# Patient Record
Sex: Female | Born: 1995 | Race: White | Hispanic: No | Marital: Single | Smoking: Never smoker
Health system: Southern US, Community
[De-identification: ages and names within clinical notes are randomized; demographics above are authoritative.]

## PROBLEM LIST (undated history)

## (undated) DIAGNOSIS — F909 Attention-deficit hyperactivity disorder, unspecified type: Secondary | ICD-10-CM

## (undated) DIAGNOSIS — F419 Anxiety disorder, unspecified: Secondary | ICD-10-CM

## (undated) DIAGNOSIS — F329 Major depressive disorder, single episode, unspecified: Secondary | ICD-10-CM

## (undated) DIAGNOSIS — F32A Depression, unspecified: Secondary | ICD-10-CM

## (undated) HISTORY — PX: BACK SURGERY: SHX140

## (undated) HISTORY — PX: SPINAL FUSION: SHX223

---
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
## 2016-11-29 ENCOUNTER — Emergency Department (INDEPENDENT_AMBULATORY_CARE_PROVIDER_SITE_OTHER)
Admission: EM | Admit: 2016-11-29 | Discharge: 2016-11-29 | Disposition: A | Discharge status: Home / Self Care | Payer: 59 | Source: Home / Self Care

## 2016-11-29 ENCOUNTER — Encounter: Payer: Self-pay | Admitting: *Deleted

## 2016-11-29 DIAGNOSIS — Z111 Encounter for screening for respiratory tuberculosis: Secondary | ICD-10-CM | POA: Diagnosis not present

## 2016-11-29 MED ORDER — TUBERCULIN PPD 5 UNIT/0.1ML ID SOLN
5.0000 [IU] | Freq: Once | INTRADERMAL | Status: DC
  Administered 2016-11-29: 5 [IU] via INTRADERMAL

## 2016-11-29 NOTE — ED Triage Notes (Signed)
The patient is here today for PPD placement. Brittany Lampkins, LPN   

## 2016-12-02 ENCOUNTER — Emergency Department
Admission: EM | Admit: 2016-12-02 | Discharge: 2016-12-02 | Disposition: A | Discharge status: Home / Self Care | Payer: 59 | Source: Home / Self Care

## 2016-12-02 NOTE — ED Notes (Signed)
Patient returned on 12/02/2016 at 5:30PM to have her TB skin test read. 0 MM result, Negative, a copy was given to the patient.

## 2017-06-01 IMAGING — DX DG ANKLE COMPLETE 3+V*L*
3 series · 3 of 3 positions shown · non-contrast
Comparison: None.

CLINICAL DATA: 20-year-old female with trauma to the left ankle.

EXAM:
LEFT ANKLE COMPLETE - 3+ VIEW; LEFT FOOT - COMPLETE 3+ VIEW

[ankle ap]
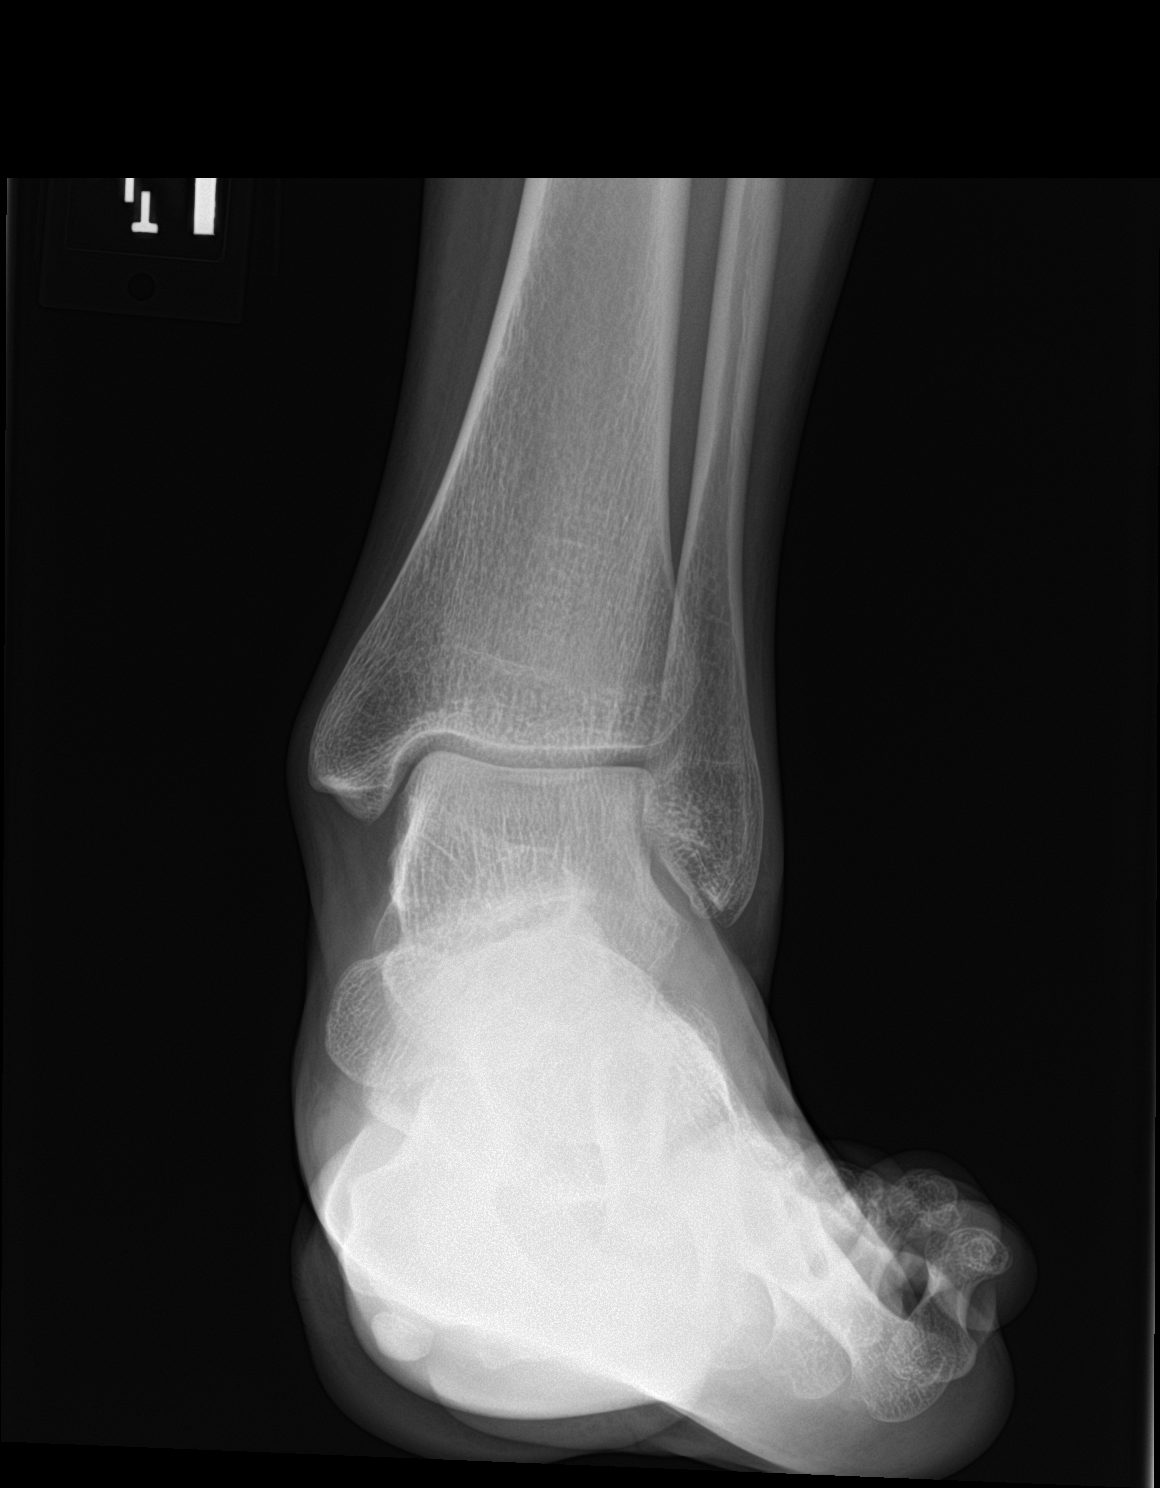

[ankle obl]
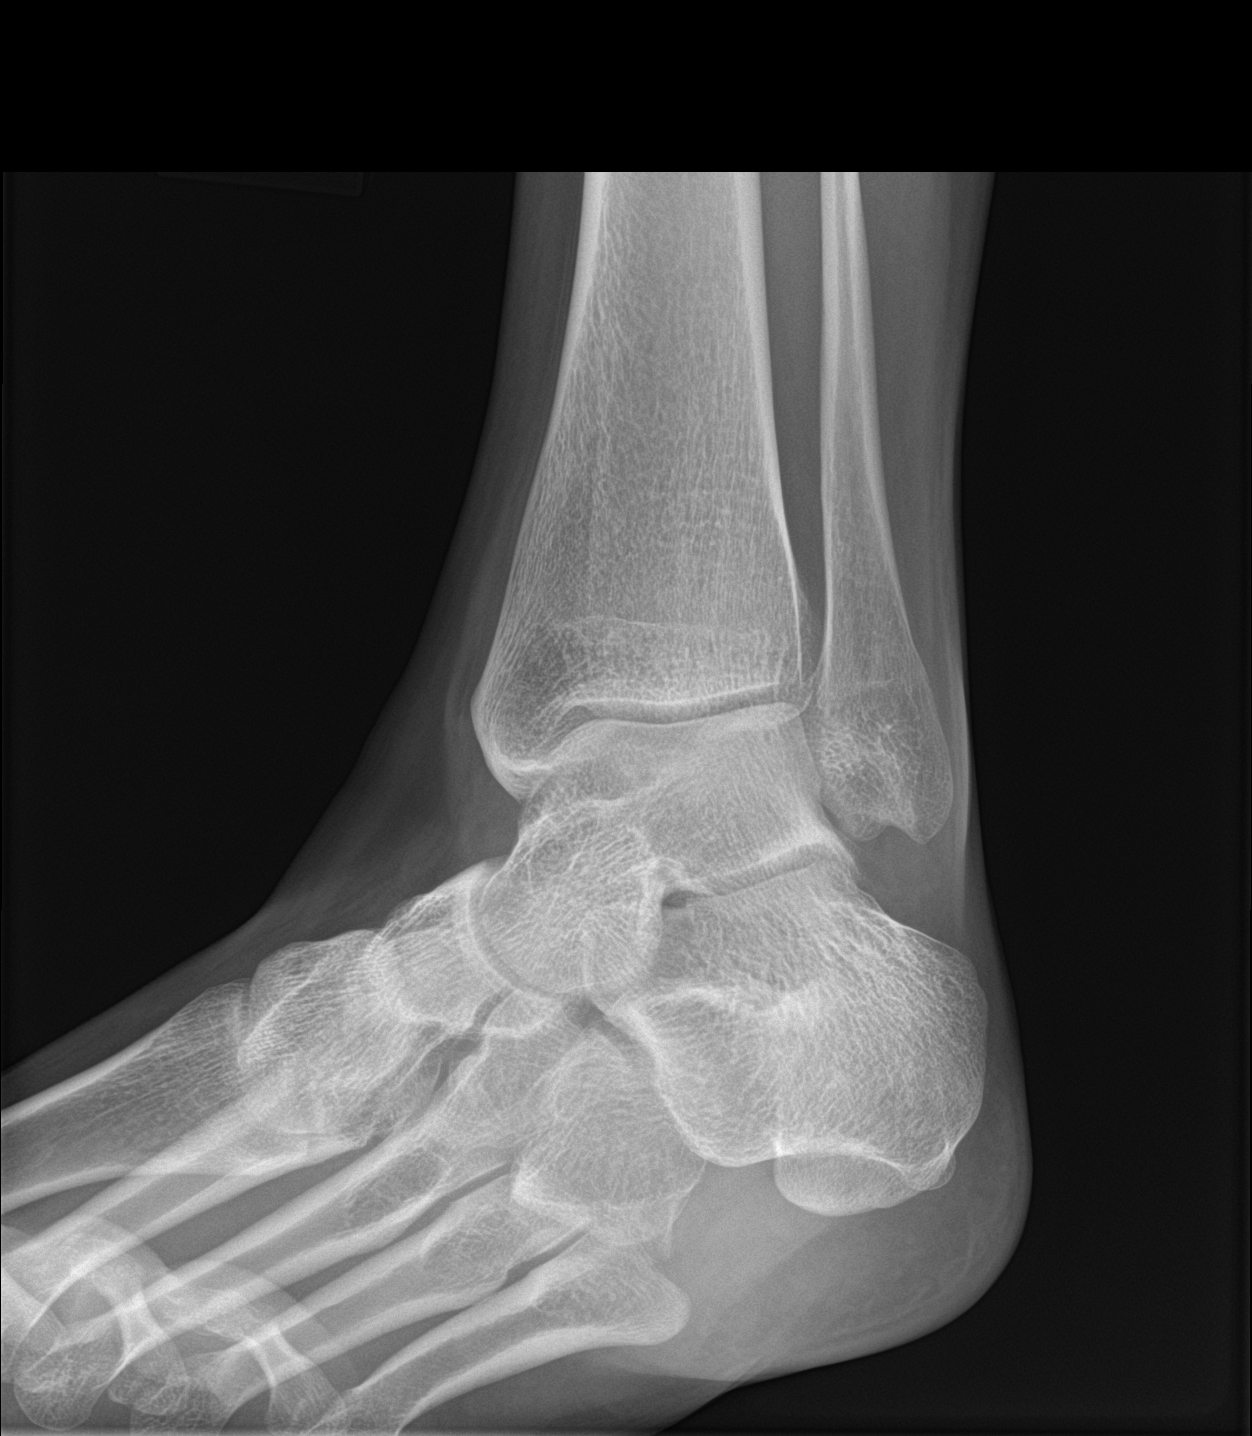

[ankle lat]
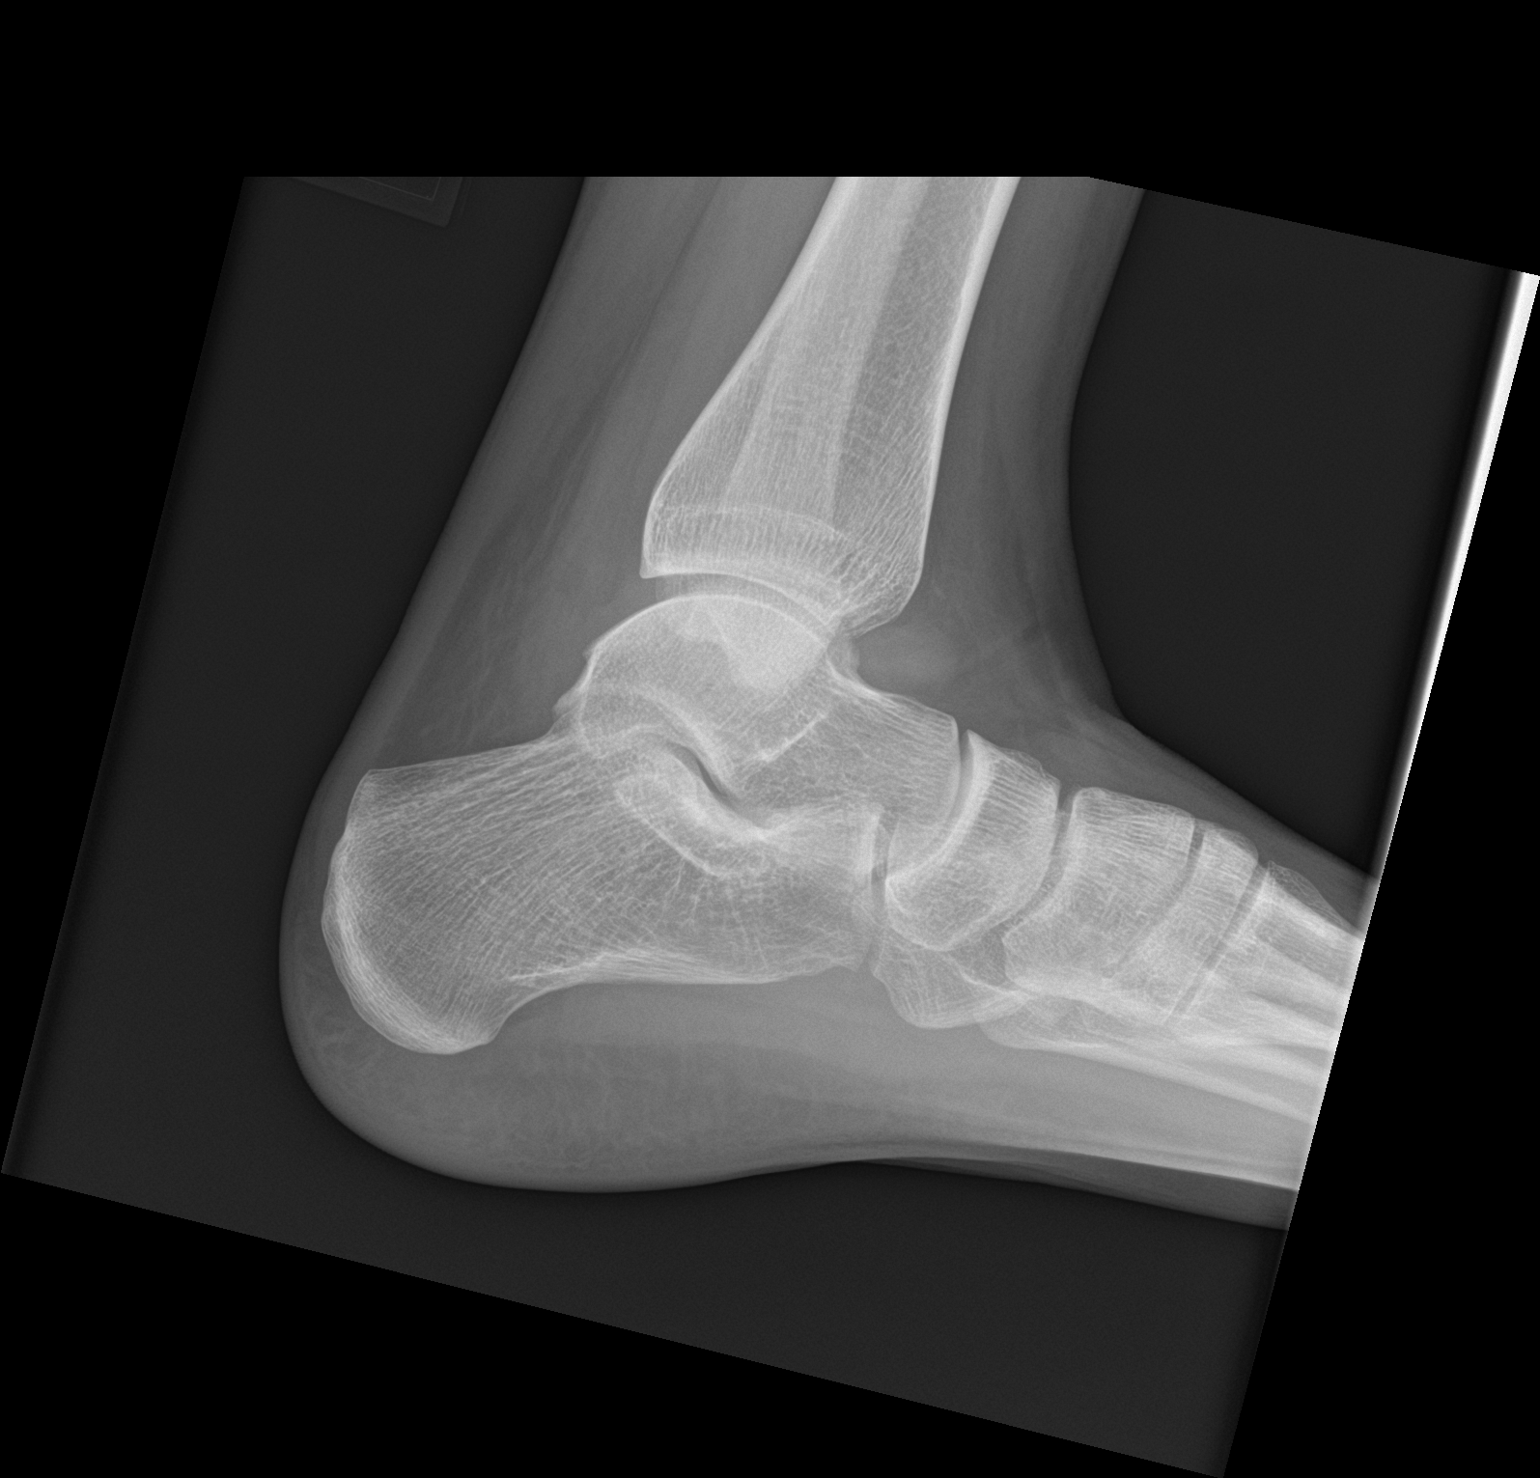

[3 of 3 positions shown; findings below may reference images not displayed]

FINDINGS: There is no evidence of fracture, dislocation, or joint effusion.
There is no evidence of arthropathy or other focal bone abnormality.
Soft tissues are unremarkable.
IMPRESSION: Negative.

## 2017-08-18 ENCOUNTER — Emergency Department
Admission: EM | Admit: 2017-08-18 | Discharge: 2017-08-18 | Disposition: A | Discharge status: Home / Self Care | Payer: 59 | Source: Home / Self Care

## 2017-08-18 ENCOUNTER — Encounter: Payer: Self-pay | Admitting: Emergency Medicine

## 2017-08-18 DIAGNOSIS — B2790 Infectious mononucleosis, unspecified without complication: Secondary | ICD-10-CM | POA: Diagnosis not present

## 2017-08-18 MED ORDER — DEXAMETHASONE SODIUM PHOSPHATE 10 MG/ML IJ SOLN
10.0000 mg | Freq: Once | INTRAMUSCULAR | Status: AC
  Administered 2017-08-18: 10 mg via INTRAMUSCULAR

## 2017-08-18 MED ORDER — HYDROCODONE-ACETAMINOPHEN 7.5-325 MG/15ML PO SOLN: 10.0000 mL | Freq: Four times a day (QID) | ORAL | 0 refills | Status: AC | PRN

## 2017-08-18 MED ORDER — MAGIC MOUTHWASH W/LIDOCAINE: 5.0000 mL | Freq: Four times a day (QID) | ORAL | 0 refills | Status: AC | PRN

## 2017-08-18 NOTE — ED Triage Notes (Signed)
Pt states she had labs and was dx with mono [facility]. States throat pain has worsened and no relief with ibuprofen. Had neg strep test at visit.

## 2017-08-19 NOTE — ED Provider Notes (Signed)
[facility]    CSN: [id number] Arrival date & time: 08/18/17  1104     History   Chief Complaint Chief Complaint  Patient presents with  . Sore Throat    HPI Brittany Sims is a 22 y.o. female.   The history is provided by the patient. No language interpreter was used.  Sore Throat  This is a new problem. The current episode started more than 1 week ago. The problem occurs constantly. The problem has been gradually worsening. Nothing aggravates the symptoms. Nothing relieves the symptoms. She has tried acetaminophen for the symptoms. The treatment provided no relief.  Pt was diagnosed with mono at [facility].    History reviewed. No pertinent past medical history.  There are no active problems to display for this patient.   History reviewed. No pertinent surgical history.  OB History   None      Home Medications    Prior to Admission medications   Medication Sig Start Date End Date Taking? Authorizing Provider  sertraline (ZOLOFT) 100 MG tablet Take 100 mg by mouth daily.   Yes [provider]  HYDROcodone-acetaminophen (HYCET) 7.5-325 mg/15 ml solution Take 10 mLs by mouth every 6 (six) hours as needed for up to 4 days for moderate pain. 08/18/17 08/22/17  Elson Areas, PA-C  magic mouthwash w/lidocaine SOLN Take 5 mLs by mouth 4 (four) times daily as needed for mouth pain. 08/18/17   Elson Areas, PA-C    Family History History reviewed. No pertinent family history.  Social History Social History   Tobacco Use  . Smoking status: Never Smoker  . Smokeless tobacco: Never Used  Substance Use Topics  . Alcohol use: No  . Drug use: No     Allergies   Patient has no known allergies.   Review of Systems Review of Systems   Physical Exam Triage Vital Signs ED Triage Vitals  Enc Vitals Group     BP 08/18/17 1122 134/78     Pulse Rate 08/18/17 1122 98     Resp --      Temp 08/18/17 1122 99.1 F (37.3 C)     Temp  Source 08/18/17 1122 Oral     SpO2 08/18/17 1122 100 %     Weight 08/18/17 1125 132 lb (59.9 kg)     Height --      Head Circumference --      Peak Flow --      Pain Score 08/18/17 1125 0     Pain Loc --      Pain Edu? --      Excl. in GC? --    No data found.  Updated Vital Signs BP 134/78 (BP Location: Right Arm)   Pulse 98   Temp 99.1 F (37.3 C) (Oral)   Wt 132 lb (59.9 kg)   LMP 08/18/2017   SpO2 100%   BMI 19.49 kg/m   Visual Acuity Right Eye Distance:   Left Eye Distance:   Bilateral Distance:    Right Eye Near:   Left Eye Near:    Bilateral Near:     Physical Exam  Constitutional: She appears well-developed and well-nourished. No distress.  HENT:  Head: Normocephalic and atraumatic.  Mouth/Throat: Oropharynx is clear and moist and mucous membranes are normal. Tonsils are 2+ on the right. Tonsils are 2+ on the left.  Eyes: Pupils are equal, round, and reactive to light. Conjunctivae and EOM are normal.  Neck: Neck supple.  Cardiovascular: Normal rate and regular rhythm.  No murmur heard. Pulmonary/Chest: Effort normal and breath sounds normal. No respiratory distress.  Abdominal: Soft. There is no tenderness.  Musculoskeletal: She exhibits no edema.  Neurological: She is alert.  Skin: Skin is warm and dry.  Psychiatric: She has a normal mood and affect.  Nursing note and vitals reviewed.    UC Treatments / Results  Labs (all labs ordered are listed, but only abnormal results are displayed) Labs Reviewed - No data to display  EKG None Radiology No results found.  Procedures Procedures (including critical care time)  Medications Ordered in UC Medications  dexamethasone (DECADRON) injection 10 mg (10 mg Intramuscular Given 08/18/17 1205)     Initial Impression / Assessment and Plan / UC Course  I have reviewed the triage vital signs and the nursing notes.  Pertinent labs & imaging results that were available during my care of the patient  were reviewed by me and considered in my medical decision making (see chart for details).     Pt given decadron IM.   I will try magic mouthwash to help with symptoms.  Pt given hydrocdone for pain.  Pt advised to drink plenty of fluids and protect her spleen.   Final Clinical Impressions(s) / UC Diagnoses   Final diagnoses:  Infectious mononucleosis without complication, infectious mononucleosis due to unspecified organism    ED Discharge Orders        Ordered    HYDROcodone-acetaminophen (HYCET) 7.5-325 mg/15 ml solution  Every 6 hours PRN     08/18/17 1201    magic mouthwash w/lidocaine SOLN  4 times daily PRN     08/18/17 1201     An After Visit Summary was printed and given to the patient.   Controlled Substance Prescriptions [place] Controlled Substance Registry consulted? Yes, I have consulted the [place] Controlled Substances Registry for this patient, and feel the risk/benefit ratio today is favorable for proceeding with this prescription for a controlled substance.   Elson AreasSofia, Leslie K, [place]PA-C 08/19/17 2025

## 2019-04-14 DIAGNOSIS — M4125 Other idiopathic scoliosis, thoracolumbar region: Secondary | ICD-10-CM | POA: Insufficient documentation

## 2019-11-22 ENCOUNTER — Other Ambulatory Visit: Payer: Self-pay

## 2019-11-22 ENCOUNTER — Encounter (HOSPITAL_COMMUNITY): Payer: Self-pay

## 2019-11-22 ENCOUNTER — Ambulatory Visit (HOSPITAL_COMMUNITY)
Admission: RE | Admit: 2019-11-22 | Discharge: 2019-11-22 | Disposition: A | Discharge status: Home / Self Care | Payer: BC Managed Care – PPO | Source: Ambulatory Visit | Attending: Family Medicine | Admitting: Family Medicine

## 2019-11-22 VITALS — BP 131/82 | HR 114 | Temp 99.0°F | Resp 18

## 2019-11-22 DIAGNOSIS — R300 Dysuria: Secondary | ICD-10-CM | POA: Insufficient documentation

## 2019-11-22 DIAGNOSIS — Z7251 High risk heterosexual behavior: Secondary | ICD-10-CM | POA: Diagnosis present

## 2019-11-22 DIAGNOSIS — N898 Other specified noninflammatory disorders of vagina: Secondary | ICD-10-CM | POA: Diagnosis present

## 2019-11-22 DIAGNOSIS — Z3202 Encounter for pregnancy test, result negative: Secondary | ICD-10-CM

## 2019-11-22 HISTORY — DX: Anxiety disorder, unspecified: F41.9

## 2019-11-22 HISTORY — DX: Depression, unspecified: F32.A

## 2019-11-22 HISTORY — DX: Attention-deficit hyperactivity disorder, unspecified type: F90.9

## 2019-11-22 LAB — POCT URINALYSIS DIP (DEVICE)
Bilirubin Urine: NEGATIVE
Glucose, UA: NEGATIVE mg/dL
Ketones, ur: NEGATIVE mg/dL
Leukocytes,Ua: NEGATIVE
Nitrite: NEGATIVE
Protein, ur: NEGATIVE mg/dL
Specific Gravity, Urine: 1.02 (ref 1.005–1.030)
Urobilinogen, UA: 0.2 mg/dL (ref 0.0–1.0)
pH: 5.5 (ref 5.0–8.0)

## 2019-11-22 LAB — POC URINE PREG, ED: Preg Test, Ur: NEGATIVE

## 2019-11-22 MED ORDER — CEFTRIAXONE SODIUM 500 MG IJ SOLR
500.0000 mg | Freq: Once | INTRAMUSCULAR | Status: AC
  Administered 2019-11-22: 500 mg via INTRAMUSCULAR

## 2019-11-22 MED ORDER — CEFTRIAXONE SODIUM 500 MG IJ SOLR
INTRAMUSCULAR | Status: AC
  Filled 2019-11-22: qty 500

## 2019-11-22 MED ORDER — DOXYCYCLINE HYCLATE 100 MG PO CAPS: 100.0000 mg | ORAL_CAPSULE | Freq: Two times a day (BID) | ORAL | 0 refills | Status: AC

## 2019-11-22 MED ORDER — LIDOCAINE HCL (PF) 1 % IJ SOLN
INTRAMUSCULAR | Status: AC
  Filled 2019-11-22: qty 2

## 2019-11-22 NOTE — Discharge Instructions (Signed)

## 2019-11-22 NOTE — ED Triage Notes (Signed)
C/O dysuria x 3 days; yesterday had fever up to 102.4.  C/O "a little bit" of diarrhea.  Denies polyuria, hematuria.  C/O pain across low back.

## 2019-11-22 NOTE — ED Provider Notes (Signed)
[facility]   [id number] 11/22/19 Arrival Time: 1149  ASSESSMENT & PLAN:  1. Dysuria   2. Vaginal discharge   3. High risk heterosexual behavior     U/A without signs of infection. Culture sent. Vaginal cytology pending.   Discharge Instructions     You have been given the following today for treatment of suspected gonorrhea and/or chlamydia:  cefTRIAXone (ROCEPHIN) injection 500 mg  Please pick up your prescription for doxycycline 100 mg and begin taking twice daily for the next seven (7) days.  Even though we have treated you today, we have sent testing for sexually transmitted infections. We will notify you of any positive results once they are received. If required, we will prescribe any medications you might need.  Please refrain from all sexual activity for at least the next seven days.     Without s/s of PID.  Labs Reviewed  POCT URINALYSIS DIP (DEVICE) - Abnormal; Notable for the following components:      Result Value   Hgb urine dipstick TRACE (*)    All other components within normal limits  URINE CULTURE  POC URINE PREG, ED  CERVICOVAGINAL ANCILLARY ONLY    Will notify of any positive results. Instructed to refrain from sexual activity for at least seven days.  Reviewed expectations re: course of current medical issues. Questions answered. Outlined signs and symptoms indicating need for more acute intervention. Patient verbalized understanding. After Visit Summary given.   SUBJECTIVE:  Brittany Sims is a 24 y.o. female who presents with complaint of dysuria first noted 2-3 days ago but is improving. Scant clear vaginal discharge also reported. No specific aggravating or alleviating factors reported. Denies: urinary frequency and gross hematuria. Afebrile. No abdominal or pelvic pain. Normal PO intake wihout n/v. No genital rashes or lesions. Reports that she is sexually active with multiple female partners (3) OTC treatment: none  reported. History of STI: none reported.  Patient's last menstrual period was 11/15/2019 (approximate).   OBJECTIVE:  Vitals:   11/22/19 1224  BP: 131/82  Pulse: (!) 114  Resp: 18  Temp: 99 F (37.2 C)  SpO2: 99%     General appearance: alert, cooperative, appears stated age and no distress Lungs: unlabored respirations; speaks full sentences without difficulty Back: no CVA tenderness reported; FROM at waist GU: deferred Skin: warm and dry Psychological: alert and cooperative; normal mood and affect.  Results for orders placed or performed during the hospital encounter of 11/22/19  POCT urinalysis dip (device)  Result Value Ref Range   Glucose, UA NEGATIVE NEGATIVE mg/dL   Bilirubin Urine NEGATIVE NEGATIVE   Ketones, ur NEGATIVE NEGATIVE mg/dL   Specific Gravity, Urine 1.020 1.005 - 1.030   Hgb urine dipstick TRACE (A) NEGATIVE   pH 5.5 5.0 - 8.0   Protein, ur NEGATIVE NEGATIVE mg/dL   Urobilinogen, UA 0.2 0.0 - 1.0 mg/dL   Nitrite NEGATIVE NEGATIVE   Leukocytes,Ua NEGATIVE NEGATIVE  POC urine preg, ED (not at [facility])  Result Value Ref Range   Preg Test, Ur NEGATIVE NEGATIVE    Labs Reviewed  POCT URINALYSIS DIP (DEVICE) - Abnormal; Notable for the following components:      Result Value   Hgb urine dipstick TRACE (*)    All other components within normal limits  URINE CULTURE  POC URINE PREG, ED  CERVICOVAGINAL ANCILLARY ONLY    No Known Allergies  Past Medical History:  Diagnosis Date  . ADHD   . Anxiety   . Depression  Family History  Problem Relation Age of Onset  . Healthy Mother   . Healthy Father    Social History   Socioeconomic History  . Marital status: Single    Spouse name: Not on file  . Number of children: Not on file  . Years of education: Not on file  . Highest education level: Not on file  Occupational History  . Not on file  Tobacco Use  . Smoking status: Never Smoker  . Smokeless tobacco: Never Used  Vaping Use  .  Vaping Use: Never used  Substance and Sexual Activity  . Alcohol use: Yes    Comment: occasionally  . Drug use: No  . Sexual activity: Yes    Birth control/protection: Pill, Condom  Other Topics Concern  . Not on file  Social History Narrative  . Not on file   Social Determinants of Health   Financial Resource Strain:   . Difficulty of Paying Living Expenses:   Food Insecurity:   . Worried About Programme researcher, broadcasting/film/video in the Last Year:   . Barista in the Last Year:   Transportation Needs:   . Freight forwarder (Medical):   Marland Kitchen Lack of Transportation (Non-Medical):   Physical Activity:   . Days of Exercise per Week:   . Minutes of Exercise per Session:   Stress:   . Feeling of Stress :   Social Connections:   . Frequency of Communication with Friends and Family:   . Frequency of Social Gatherings with Friends and Family:   . Attends Religious Services:   . Active Member of Clubs or Organizations:   . Attends Banker Meetings:   Marland Kitchen Marital Status:   Intimate Partner Violence:   . Fear of Current or Ex-Partner:   . Emotionally Abused:   Marland Kitchen Physically Abused:   . Sexually Abused:           Mardella Layman, MD 11/22/19 [phone]

## 2019-11-23 LAB — URINE CULTURE: Culture: NO GROWTH

## 2019-11-25 LAB — CERVICOVAGINAL ANCILLARY ONLY
Bacterial Vaginitis (gardnerella): POSITIVE — AB
Candida Glabrata: NEGATIVE
Candida Vaginitis: POSITIVE — AB
Chlamydia: NEGATIVE
Comment: NEGATIVE
Comment: NEGATIVE
Comment: NEGATIVE
Comment: NEGATIVE
Comment: NEGATIVE
Comment: NORMAL
Neisseria Gonorrhea: NEGATIVE
Trichomonas: NEGATIVE

## 2019-11-26 ENCOUNTER — Telehealth (HOSPITAL_COMMUNITY): Payer: Self-pay | Admitting: Emergency Medicine

## 2019-11-26 MED ORDER — METRONIDAZOLE 500 MG PO TABS: 500.0000 mg | ORAL_TABLET | Freq: Two times a day (BID) | ORAL | 0 refills | Status: AC

## 2019-11-26 MED ORDER — FLUCONAZOLE 150 MG PO TABS: 150.0000 mg | ORAL_TABLET | Freq: Every day | ORAL | 0 refills | Status: AC

## 2019-12-31 ENCOUNTER — Emergency Department
Admission: RE | Admit: 2019-12-31 | Discharge: 2019-12-31 | Discharge status: Absent without leave | Payer: BC Managed Care – PPO | Source: Ambulatory Visit

## 2019-12-31 ENCOUNTER — Other Ambulatory Visit: Payer: Self-pay

## 2020-01-01 ENCOUNTER — Emergency Department (INDEPENDENT_AMBULATORY_CARE_PROVIDER_SITE_OTHER)
Admission: EM | Admit: 2020-01-01 | Discharge: 2020-01-01 | Disposition: A | Discharge status: Home / Self Care | Payer: BC Managed Care – PPO | Source: Home / Self Care

## 2020-01-01 ENCOUNTER — Encounter: Payer: Self-pay | Admitting: Emergency Medicine

## 2020-01-01 DIAGNOSIS — L089 Local infection of the skin and subcutaneous tissue, unspecified: Secondary | ICD-10-CM

## 2020-01-01 DIAGNOSIS — S60821A Blister (nonthermal) of right wrist, initial encounter: Secondary | ICD-10-CM

## 2020-01-01 MED ORDER — CEPHALEXIN 500 MG PO CAPS: 500.0000 mg | ORAL_CAPSULE | Freq: Two times a day (BID) | ORAL | 0 refills | Status: AC

## 2020-01-01 MED ORDER — MUPIROCIN 2 % EX OINT: TOPICAL_OINTMENT | CUTANEOUS | 0 refills | Status: AC

## 2020-01-01 NOTE — Discharge Instructions (Signed)
  Apply a warm damp washcloth to the area 2-3 times daily, keep clean with antibacterial soap. Pat dry, then apply the prescribed ointment.  Follow up with your family doctor in 4-5 days if not improving, sooner if worsening. You may return to urgent care if needed.

## 2020-01-01 NOTE — ED Provider Notes (Signed)
[facility]    CSN: [id number] Arrival date & time: 01/01/20  0813      History   Chief Complaint Chief Complaint  Patient presents with  . Blister    R arm    HPI Brittany Sims is a 24 y.o. female.   HPI Brittany Sims is a 24 y.o. female presenting to UC with c/o itchy red mildly painful blister on Right wrist that she noticed as small red bumps 3 days ago.  She cleaned the area with hydrogen peroxide the first 2 days.  Denies known insect bites. No known exposure to poison ivy.  No other rashes.     Past Medical History:  Diagnosis Date  . ADHD   . Anxiety   . Depression     Patient Active Problem List   Diagnosis Date Noted  . Idiopathic scoliosis of thoracolumbar spine 04/14/2019  . Left ankle pain 05/28/2014  . Sprain of left ankle 05/28/2014  . Scoliosis 02/17/2011    Past Surgical History:  Procedure Laterality Date  . BACK SURGERY    . SPINAL FUSION      OB History   No obstetric history on file.      Home Medications    Prior to Admission medications   Medication Sig Start Date End Date Taking? Authorizing Provider  lamoTRIgine (LAMICTAL PO) Take by mouth.   Yes [provider]  Lisdexamfetamine Dimesylate (VYVANSE PO) Take by mouth.   Yes [provider]  Norgestimate-Ethinyl Estradiol Triphasic (TRI-SPRINTEC) 0.18/0.215/0.25 MG-35 MCG tablet Take 1 tablet by mouth daily.   Yes [provider]  sertraline (ZOLOFT) 100 MG tablet Take 100 mg by mouth daily.   Yes [provider]  cephALEXin (KEFLEX) 500 MG capsule Take 1 capsule (500 mg total) by mouth 2 (two) times daily. 01/01/20   Lurene Shadow, PA-C  doxycycline (VIBRAMYCIN) 100 MG capsule Take 1 capsule (100 mg total) by mouth 2 (two) times daily. 11/22/19   Mardella Layman, MD  magic mouthwash w/lidocaine SOLN Take 5 mLs by mouth 4 (four) times daily as needed for mouth pain. 08/18/17   Elson Areas, PA-C  metroNIDAZOLE (FLAGYL) 500 MG  tablet Take 1 tablet (500 mg total) by mouth 2 (two) times daily. 11/26/19   Merrilee Jansky, MD  mupirocin ointment Idelle Jo) 2 % Apply to wound 3 times daily for 5 days 01/01/20   Lurene Shadow, PA-C    Family History Family History  Problem Relation Age of Onset  . Healthy Mother   . Healthy Father     Social History Social History   Tobacco Use  . Smoking status: Never Smoker  . Smokeless tobacco: Never Used  Vaping Use  . Vaping Use: Never used  Substance Use Topics  . Alcohol use: Yes    Comment: occasionally  . Drug use: No     Allergies   Patient has no known allergies.   Review of Systems Review of Systems  Constitutional: Negative for chills and fever.  HENT: Negative for facial swelling.   Respiratory: Negative for shortness of breath.   Gastrointestinal: Negative for nausea and vomiting.  Musculoskeletal: Negative for arthralgias and joint swelling.  Skin: Positive for color change.     Physical Exam Triage Vital Signs ED Triage Vitals  Enc Vitals Group     BP 01/01/20 0834 (!) 145/87     Pulse Rate 01/01/20 0834 72     Resp 01/01/20 0834 15  Temp 01/01/20 0834 98.8 F (37.1 C)     Temp Source 01/01/20 0834 Oral     SpO2 01/01/20 0834 100 %     Weight --      Height --      Head Circumference --      Peak Flow --      Pain Score 01/01/20 0835 2     Pain Loc --      Pain Edu? --      Excl. in GC? --    No data found.  Updated Vital Signs BP (!) 145/87 (BP Location: Right Arm)   Pulse 72   Temp 98.8 F (37.1 C) (Oral)   Resp 15   LMP 12/13/2019 (Exact Date)   SpO2 100%   Visual Acuity Right Eye Distance:   Left Eye Distance:   Bilateral Distance:    Right Eye Near:   Left Eye Near:    Bilateral Near:     Physical Exam Vitals and nursing note reviewed.  Constitutional:      Appearance: Normal appearance. She is well-developed.  HENT:     Head: Normocephalic and atraumatic.  Cardiovascular:     Rate and Rhythm:  Normal rate and regular rhythm.     Pulses:          Radial pulses are 2+ on the right side.  Pulmonary:     Effort: Pulmonary effort is normal.  Musculoskeletal:        General: No swelling or tenderness. Normal range of motion.     Cervical back: Normal range of motion.  Skin:    General: Skin is warm and dry.       Neurological:     Mental Status: She is alert and oriented to person, place, and time.  Psychiatric:        Behavior: Behavior normal.      UC Treatments / Results  Labs (all labs ordered are listed, but only abnormal results are displayed) Labs Reviewed - No data to display  EKG   Radiology No results found.  Procedures Procedures (including critical care time)  Medications Ordered in UC Medications - No data to display  Initial Impression / Assessment and Plan / UC Course  I have reviewed the triage vital signs and the nursing notes.  Pertinent labs & imaging results that were available during my care of the patient were reviewed by me and considered in my medical decision making (see chart for details).    Will tx for skin infection, suspect infected insect bite May try topical antibiotic ointment first, PO keflex also given if not improving with mupirocin F/u PCP AVS given  Final Clinical Impressions(s) / UC Diagnoses   Final diagnoses:  Infected blister of right wrist, initial encounter     Discharge Instructions      Apply a warm damp washcloth to the area 2-3 times daily, keep clean with antibacterial soap. Pat dry, then apply the prescribed ointment.  Follow up with your family doctor in 4-5 days if not improving, sooner if worsening. You may return to urgent care if needed.    ED Prescriptions    Medication Sig Dispense Auth. Provider   mupirocin ointment (BACTROBAN) 2 % Apply to wound 3 times daily for 5 days 22 g Montgomery Favor O, PA-C   cephALEXin (KEFLEX) 500 MG capsule Take 1 capsule (500 mg total) by mouth 2 (two) times  daily. 14 capsule Rolla Plate     PDMP  not reviewed this encounter.   Lurene Shadow, [place] 01/05/20 [phone]

## 2020-01-01 NOTE — ED Triage Notes (Signed)
Dime sized blister to R forearm - pt noticed small red bumps on R forearm on Monday am , turned into one large blister Pt put hydrogen peroxide on area on Monday & Tuesday - no drainage - c/o itching Denies fever or new foods or soaps or lotions Pt denies any outside exposures COVID vaccine [facility] March 2021

## 2023-09-27 ENCOUNTER — Ambulatory Visit
Admission: RE | Admit: 2023-09-27 | Discharge: 2023-09-27 | Disposition: A | Discharge status: Home / Self Care | Source: Ambulatory Visit | Attending: Family Medicine | Admitting: Family Medicine

## 2023-09-27 VITALS — BP 144/88 | HR 68 | Temp 98.3°F | Resp 18 | Ht 70.0 in | Wt 130.0 lb

## 2023-09-27 DIAGNOSIS — H938X3 Other specified disorders of ear, bilateral: Secondary | ICD-10-CM

## 2023-09-27 MED ORDER — PREDNISONE 20 MG PO TABS: ORAL_TABLET | ORAL | 0 refills | Status: AC

## 2023-09-27 NOTE — ED Triage Notes (Signed)
 Patient c/o right ear fullness x 4 days, denies any cold sx's.  Afebrile.  Patient did take OTC cold meds to see if it would help, no relief.

## 2023-09-27 NOTE — Discharge Instructions (Addendum)
 Advised patient to take medication as directed with food to completion.  Encouraged to increase daily water intake to 64 ounces per day while taking this medication.  Advised if symptoms worsen and/or unresolved please follow-up with your PCP, here, ENT, or [facility] for further evaluation.

## 2023-09-27 NOTE — ED Provider Notes (Signed)
 [facility]    CSN: [id number] Arrival date & time: 09/27/23  1642      History   Chief Complaint Chief Complaint  Patient presents with   Ear Fullness    My ear has felt clogged for several days now, there is a faint ringing sound, and it has started to hurt in louder environments. - Entered by patient    HPI Brittany Sims is a 28 y.o. female.   HPI 28 year old female presents with right ear fullness for 4 days believes that her ear is clogged.  PMH significant for ADHD  Past Medical History:  Diagnosis Date   ADHD    Anxiety    Depression     Patient Active Problem List   Diagnosis Date Noted   Idiopathic scoliosis of thoracolumbar spine 04/14/2019   Left ankle pain 05/28/2014   Sprain of left ankle 05/28/2014   Scoliosis 02/17/2011    Past Surgical History:  Procedure Laterality Date   BACK SURGERY     SPINAL FUSION      OB History   No obstetric history on file.      Home Medications    Prior to Admission medications   Medication Sig Start Date End Date Taking? Authorizing Provider  Lisdexamfetamine Dimesylate (VYVANSE PO) Take by mouth.   Yes [provider]  Norgestimate-Ethinyl Estradiol Triphasic (TRI-SPRINTEC) 0.18/0.215/0.25 MG-35 MCG tablet Take 1 tablet by mouth daily.   Yes [provider]  predniSONE (DELTASONE) 20 MG tablet Take 3 tabs PO daily x 5 days. 09/27/23  Yes Brittany Ramp, FNP  cephALEXin  (KEFLEX ) 500 MG capsule Take 1 capsule (500 mg total) by mouth 2 (two) times daily. 01/01/20   Brittany Eriksson, PA-C  doxycycline  (VIBRAMYCIN ) 100 MG capsule Take 1 capsule (100 mg total) by mouth 2 (two) times daily. 11/22/19   Brittany Albright, MD  lamoTRIgine (LAMICTAL PO) Take by mouth.    [provider]  magic mouthwash w/lidocaine  SOLN Take 5 mLs by mouth 4 (four) times daily as needed for mouth pain. 08/18/17   Sofia, Brittany K, PA-C  metroNIDAZOLE  (FLAGYL ) 500 MG tablet Take 1 tablet (500 mg total) by mouth  2 (two) times daily. 11/26/19   Brittany Dice, MD  mupirocin  ointment (BACTROBAN ) 2 % Apply to wound 3 times daily for 5 days 01/01/20   Brittany Eriksson, PA-C  sertraline (ZOLOFT) 100 MG tablet Take 100 mg by mouth daily.    [provider]    Family History Family History  Problem Relation Age of Onset   Healthy Mother    Healthy Father     Social History Social History   Tobacco Use   Smoking status: Never   Smokeless tobacco: Never  Vaping Use   Vaping status: Never Used  Substance Use Topics   Alcohol use: Yes    Comment: occasionally   Drug use: No     Allergies   Patient has no known allergies.   Review of Systems Review of Systems  HENT:  Negative for hearing loss.        Right ear fullness x 4-5 days     Physical Exam Triage Vital Signs ED Triage Vitals  Encounter Vitals Group     BP      Systolic BP Percentile      Diastolic BP Percentile      Pulse      Resp      Temp      Temp src  SpO2      Weight      Height      Head Circumference      Peak Flow      Pain Score      Pain Loc      Pain Education      Exclude from Growth Chart    No data found.  Updated Vital Signs BP (!) 144/88 (BP Location: Right Arm)   Pulse 68   Temp 98.3 F (36.8 C) (Oral)   Resp 18   Ht 5\' 10"  (1.778 m)   Wt 130 lb (59 kg)   LMP 09/13/2023   SpO2 98%   BMI 18.65 kg/m    Physical Exam Vitals and nursing note reviewed.  Constitutional:      Appearance: Normal appearance. She is normal weight. She is not ill-appearing.  HENT:     Head: Normocephalic and atraumatic.     Right Ear: Tympanic membrane and external ear normal.     Left Ear: Tympanic membrane and external ear normal.     Ears:     Comments: Mild eustachian tube dysfunction noted bilaterally    Mouth/Throat:     Mouth: Mucous membranes are moist.     Pharynx: Oropharynx is clear.  Eyes:     Extraocular Movements: Extraocular movements intact.     Conjunctiva/sclera:  Conjunctivae normal.     Pupils: Pupils are equal, round, and reactive to light.  Cardiovascular:     Rate and Rhythm: Normal rate and regular rhythm.     Pulses: Normal pulses.     Heart sounds: Normal heart sounds.  Pulmonary:     Effort: Pulmonary effort is normal.     Breath sounds: Normal breath sounds. No wheezing, rhonchi or rales.  Musculoskeletal:        General: Normal range of motion.     Cervical back: Normal range of motion and neck supple.  Skin:    General: Skin is warm and dry.  Neurological:     General: No focal deficit present.     Mental Status: She is alert and oriented to person, place, and time. Mental status is at baseline.  Psychiatric:        Mood and Affect: Mood normal.        Behavior: Behavior normal.        Thought Content: Thought content normal.      UC Treatments / Results  Labs (all labs ordered are listed, but only abnormal results are displayed) Labs Reviewed - No data to display  EKG   Radiology No results found.  Procedures Procedures (including critical care time)  Medications Ordered in UC Medications - No data to display  Initial Impression / Assessment and Plan / UC Course  I have reviewed the triage vital signs and the nursing notes.  Pertinent labs & imaging results that were available during my care of the patient were reviewed by me and considered in my medical decision making (see chart for details).     MDM: 1.  Ear fullness, bilateral-Rx'd prednisone 20 mg tablet: Take 3 tabs p.o. daily x 5 days. Advised patient to take medication as directed with food to completion.  Encouraged to increase daily water intake to 64 ounces per day while taking this medication.  Advised if symptoms worsen and/or unresolved please follow-up with your PCP, here, ENT, or [facility] for further evaluation.  Patient discharged home, hemodynamically stable. Final Clinical Impressions(s) / UC Diagnoses  Final diagnoses:  Ear  fullness, bilateral     Discharge Instructions      Advised patient to take medication as directed with food to completion.  Encouraged to increase daily water intake to 64 ounces per day while taking this medication.  Advised if symptoms worsen and/or unresolved please follow-up with your PCP, here, ENT, or [facility] for further evaluation.   ED Prescriptions     Medication Sig Dispense Auth. Provider   predniSONE (DELTASONE) 20 MG tablet Take 3 tabs PO daily x 5 days. 15 tablet Fidencio Duddy, FNP      PDMP not reviewed this encounter.   Brittany Ramp, FNP 09/27/23 1725

## 2024-05-26 ENCOUNTER — Other Ambulatory Visit: Payer: Self-pay | Admitting: Medical Genetics
